# Patient Record
Sex: Female | Born: 1985
Health system: Southern US, Community
[De-identification: ages and names within clinical notes are randomized; demographics above are authoritative.]

## PROBLEM LIST (undated history)

## (undated) DIAGNOSIS — D649 Anemia, unspecified: Secondary | ICD-10-CM

## (undated) HISTORY — PX: LEG SURGERY: SHX1003

---

## 2007-02-04 ENCOUNTER — Emergency Department (HOSPITAL_COMMUNITY): Admission: EM | Admit: 2007-02-04 | Discharge: 2007-02-04 | Payer: Self-pay | Admitting: Emergency Medicine

## 2007-06-26 ENCOUNTER — Other Ambulatory Visit: Admission: RE | Admit: 2007-06-26 | Discharge: 2007-06-26 | Payer: Self-pay | Admitting: Family Medicine

## 2008-12-27 ENCOUNTER — Emergency Department (HOSPITAL_COMMUNITY): Admission: EM | Admit: 2008-12-27 | Discharge: 2008-12-27 | Payer: Self-pay | Admitting: Emergency Medicine

## 2009-10-11 ENCOUNTER — Emergency Department (HOSPITAL_COMMUNITY): Admission: EM | Admit: 2009-10-11 | Discharge: 2009-10-11 | Payer: Self-pay | Admitting: Family Medicine

## 2009-11-16 ENCOUNTER — Other Ambulatory Visit: Admission: RE | Admit: 2009-11-16 | Discharge: 2009-11-16 | Payer: Self-pay | Admitting: Family Medicine

## 2010-03-22 ENCOUNTER — Emergency Department (HOSPITAL_COMMUNITY): Admission: EM | Admit: 2010-03-22 | Discharge: 2010-03-22 | Payer: Self-pay | Admitting: Family Medicine

## 2010-08-06 ENCOUNTER — Emergency Department (HOSPITAL_COMMUNITY)
Admission: EM | Admit: 2010-08-06 | Discharge: 2010-08-06 | Payer: Self-pay | Source: Home / Self Care | Admitting: Family Medicine

## 2010-11-11 LAB — POCT URINALYSIS DIP (DEVICE)
Bilirubin Urine: NEGATIVE
Glucose, UA: NEGATIVE mg/dL
Nitrite: NEGATIVE

## 2010-11-11 LAB — POCT PREGNANCY, URINE: Preg Test, Ur: NEGATIVE

## 2010-11-15 LAB — POCT I-STAT, CHEM 8
Calcium, Ion: 1.13 mmol/L (ref 1.12–1.32)
Hemoglobin: 13.9 g/dL (ref 12.0–15.0)
Sodium: 139 mEq/L (ref 135–145)
TCO2: 28 mmol/L (ref 0–100)

## 2010-11-15 LAB — POCT RAPID STREP A (OFFICE): Streptococcus, Group A Screen (Direct): NEGATIVE

## 2011-06-14 LAB — POCT RAPID STREP A: Streptococcus, Group A Screen (Direct): NEGATIVE

## 2011-09-17 ENCOUNTER — Emergency Department (INDEPENDENT_AMBULATORY_CARE_PROVIDER_SITE_OTHER)
Admission: EM | Admit: 2011-09-17 | Discharge: 2011-09-17 | Disposition: A | Discharge status: Home / Self Care | Payer: 59 | Source: Home / Self Care | Attending: Family Medicine | Admitting: Family Medicine

## 2011-09-17 ENCOUNTER — Encounter (HOSPITAL_COMMUNITY): Payer: Self-pay

## 2011-09-17 DIAGNOSIS — J069 Acute upper respiratory infection, unspecified: Secondary | ICD-10-CM

## 2011-09-17 HISTORY — DX: Anemia, unspecified: D64.9

## 2011-09-17 MED ORDER — PREDNISONE 20 MG PO TABS: ORAL_TABLET | ORAL | Status: AC

## 2011-09-17 MED ORDER — ACETAMINOPHEN-CODEINE 120-12 MG/5ML PO SUSP: 5.0000 mL | Freq: Four times a day (QID) | ORAL | Status: AC | PRN

## 2011-09-17 MED ORDER — AZITHROMYCIN 250 MG PO TABS: 250.0000 mg | ORAL_TABLET | Freq: Every day | ORAL | Status: AC

## 2011-09-17 MED ORDER — FEXOFENADINE-PSEUDOEPHED ER 60-120 MG PO TB12: 1.0000 | ORAL_TABLET | Freq: Two times a day (BID) | ORAL | Status: AC

## 2011-09-17 NOTE — ED Notes (Signed)
C/o runny nose, nasal congestion, cough, bilateral ear pain, headache and sore throat for 3 days.  Denies fever.

## 2011-09-18 NOTE — ED Provider Notes (Signed)
History     CSN: [id number]  Arrival date & time 09/17/11  1618   First MD Initiated Contact with Patient 09/17/11 1740      Chief Complaint  Patient presents with  . URI  . Sore Throat  . Otalgia    (Consider location/radiation/quality/duration/timing/severity/associated sxs/prior treatment) HPI Comments: 26 y/o female h/o recurrent "bad sinusitis" here c/o runny nose nasal congestion, sore throat and sinus pressure for 3 days. No fever. Thinks she is getting worse. Mild non productive cough. Feel ears full and painful. Taking Musinex with no significant improvement.  Takes ampicillin chronically for acne. No chest pain. Appetite is ok.   Past Medical History  Diagnosis Date  . Anemia     Past Surgical History  Procedure Date  . Leg surgery     No family history on file.  History  Substance Use Topics  . Smoking status: Never Smoker   . Smokeless tobacco: Not on file  . Alcohol Use: No    OB History    Grav Para Term Preterm Abortions TAB SAB Ect Mult Living                  Review of Systems  Constitutional: Negative for fever, chills and appetite change.  HENT: Positive for ear pain, congestion, sore throat, rhinorrhea and sinus pressure. Negative for neck pain and neck stiffness.   Respiratory: Positive for cough. Negative for chest tightness, shortness of breath and wheezing.   Cardiovascular: Negative for chest pain.  Gastrointestinal: Negative for nausea, vomiting and abdominal pain.  Musculoskeletal: Negative for myalgias and arthralgias.  Skin: Negative for rash.  Neurological: Positive for headaches.    Allergies  Review of patient's allergies indicates no known allergies.  Home Medications   Current Outpatient Rx  Name Route Sig Dispense Refill  . AMPICILLIN 500 MG PO CAPS Oral Take 500 mg by mouth daily.    . IRON PO Oral Take 27 mg by mouth daily.    . ACETAMINOPHEN-CODEINE 120-12 MG/5ML PO SUSP Oral Take 5 mLs by mouth every 6 (six) hours  as needed for pain. 60 mL 0  . AZITHROMYCIN 250 MG PO TABS Oral Take 1 tablet (250 mg total) by mouth daily. Take first 2 tablets together, then 1 every day until finished. 6 tablet 0  . FEXOFENADINE-PSEUDOEPHED ER 60-120 MG PO TB12 Oral Take 1 tablet by mouth every 12 (twelve) hours. 30 tablet 0  . PREDNISONE 20 MG PO TABS  2 tabs po daily for 5 days 10 tablet no    BP 136/79  Pulse 87  Temp(Src) 99.1 F (37.3 C) (Oral)  Resp 20  SpO2 98%  LMP 09/10/2011  Physical Exam  Nursing note and vitals reviewed. Constitutional: She is oriented to person, place, and time. She appears well-developed and well-nourished. No distress.  Eyes: Conjunctivae and EOM are normal. Pupils are equal, round, and reactive to light. Right eye exhibits no discharge. Left eye exhibits no discharge.       Nasal Congestion with erythema and swelling of nasal turbinates, clear rhinorrhea. Pharyngeal erythema no exudates. No uvula deviation. No trismus. TM's with increased vascular markings and clear fluid behind. No swelling or bulging  Neck: Neck supple. No thyromegaly present.  Cardiovascular: Normal heart sounds.   Pulmonary/Chest: Effort normal and breath sounds normal. No respiratory distress. She has no wheezes. She has no rales. She exhibits no tenderness.  Lymphadenopathy:    She has no cervical adenopathy.  Neurological: She is alert and  oriented to person, place, and time.  Skin: No rash noted.    ED Course  Procedures (including critical care time)  Labs Reviewed - No data to display No results found.   1. URI (upper respiratory infection)       MDM  Impress viral sinusitis. Treated with prednisone, decongestant/antihistamin and saline solution. As per patient request a holding prescription for azithromycin was given to use only if persistent or worsening sinus symptoms after 5-7 days from day #1.         Sharin Grave, MD 09/18/11 1644

## 2014-12-28 ENCOUNTER — Ambulatory Visit: Payer: Self-pay | Admitting: Nurse Practitioner

## 2014-12-28 DIAGNOSIS — Z0289 Encounter for other administrative examinations: Secondary | ICD-10-CM

## 2015-11-22 ENCOUNTER — Emergency Department
Admission: EM | Admit: 2015-11-22 | Discharge: 2015-11-23 | Disposition: A | Discharge status: Home / Self Care | Payer: 59 | Attending: Emergency Medicine | Admitting: Emergency Medicine

## 2015-11-22 ENCOUNTER — Encounter: Payer: Self-pay | Admitting: Emergency Medicine

## 2015-11-22 DIAGNOSIS — R200 Anesthesia of skin: Secondary | ICD-10-CM | POA: Diagnosis not present

## 2015-11-22 DIAGNOSIS — Z792 Long term (current) use of antibiotics: Secondary | ICD-10-CM | POA: Diagnosis not present

## 2015-11-22 DIAGNOSIS — Z79899 Other long term (current) drug therapy: Secondary | ICD-10-CM | POA: Insufficient documentation

## 2015-11-22 DIAGNOSIS — R202 Paresthesia of skin: Secondary | ICD-10-CM

## 2015-11-22 NOTE — ED Provider Notes (Signed)
[facility] Emergency Department Provider Note  ____________________________________________  Time seen: 11:45 PM  I have reviewed the triage vital signs and the nursing notes.   HISTORY  Chief Complaint Headache and Tingling      HPI Jade Donovan is a 30 y.o. female with history of recently starting birth control Kandice Robinsons) 1 week ago presents to the emergency department with numbness and tingling sensation to the right forearm extending from the elbow to the hand first noted today. Patient also admits generalized headache and nausea 1 week since been taking Yazmin. Patient denies any shortness of breath no nausea no vomiting no gait instability no weakness or visual changes.  Past Medical History  Diagnosis Date  . Anemia     There are no active problems to display for this patient.   Past Surgical History  Procedure Laterality Date  . Leg surgery      Current Outpatient Rx  Name  Route  Sig  Dispense  Refill  . sertraline (ZOLOFT) 50 MG tablet   Oral   Take 50 mg by mouth daily.         Marland Kitchen ampicillin (PRINCIPEN) 500 MG capsule   Oral   Take 500 mg by mouth daily.         . IRON PO   Oral   Take 27 mg by mouth daily.           Allergies Review of patient's allergies indicates no known allergies.  No family history on file.  Social History Social History  Substance Use Topics  . Smoking status: Never Smoker   . Smokeless tobacco: None  . Alcohol Use: No    Review of Systems  Constitutional: Negative for fever. Eyes: Negative for visual changes. ENT: Negative for sore throat. Cardiovascular: Negative for chest pain. Respiratory: Negative for shortness of breath. Gastrointestinal: Negative for abdominal pain, vomiting and diarrhea. Genitourinary: Negative for dysuria. Musculoskeletal: Negative for back pain. Skin: Negative for rash. Neurological: Negative for headaches, focal weakness or numbness. Right forearm  numbness/tingling  10-point ROS otherwise negative.  ____________________________________________   PHYSICAL EXAM:  VITAL SIGNS: ED Triage Vitals  Enc Vitals Group     BP 11/22/15 2100 133/87 mmHg     Pulse Rate 11/22/15 2100 72     Resp 11/22/15 2100 20     Temp 11/22/15 2100 98.1 F (36.7 C)     Temp Source 11/22/15 2100 Oral     SpO2 11/22/15 2100 100 %     Weight 11/22/15 2100 272 lb (123.378 kg)     Height 11/22/15 2100  (1.702 m)     Head Cir --      Peak Flow --      Pain Score 11/22/15 2058 4     Pain Loc --      Pain Edu? --      Excl. in GC? --      Constitutional: Alert and oriented. Well appearing and in no distress. Eyes: Conjunctivae are normal. PERRL. Normal extraocular movements. ENT   Head: Normocephalic and atraumatic.   Nose: No congestion/rhinnorhea.   Mouth/Throat: Mucous membranes are moist.   Neck: No stridor. Hematological/Lymphatic/Immunilogical: No cervical lymphadenopathy. Cardiovascular: Normal rate, regular rhythm. Normal and symmetric distal pulses are present in all extremities. No murmurs, rubs, or gallops. Respiratory: Normal respiratory effort without tachypnea nor retractions. Breath sounds are clear and equal bilaterally. No wheezes/rales/rhonchi. Gastrointestinal: Soft and nontender. No distention. There is no CVA tenderness. Genitourinary: deferred Musculoskeletal:  Nontender with normal range of motion in all extremities. No joint effusions.  No lower extremity tenderness nor edema. Neurologic:  Normal speech and language. No gross focal neurologic deficits are appreciated. Speech is normal.  Skin:  Skin is warm, dry and intact. No rash noted. Psychiatric: Mood and affect are normal. Speech and behavior are normal. Patient exhibits appropriate insight and judgment.   RADIOLOGY    CT Head Wo Contrast (Final result) Result time: 11/23/15 02:22:45   Final result by Rad Results In Interface (11/23/15 02:22:45)    Narrative:   CLINICAL DATA: Acute onset of right arm tingling and generalized headache. Initial encounter.  EXAM: CT HEAD WITHOUT CONTRAST  TECHNIQUE: Contiguous axial images were obtained from the base of the skull through the vertex without intravenous contrast.  COMPARISON: None.  FINDINGS: There is no evidence of acute infarction, mass lesion, or intra- or extra-axial hemorrhage on CT.  The posterior fossa, including the cerebellum, brainstem and fourth ventricle, is within normal limits. The third and lateral ventricles, and basal ganglia are unremarkable in appearance. The cerebral hemispheres are symmetric in appearance, with normal gray-white differentiation. No mass effect or midline shift is seen.  There is no evidence of fracture; visualized osseous structures are unremarkable in appearance. There is incomplete fusion of the anterior and posterior arches of C1. The orbits are within normal limits. The paranasal sinuses and mastoid air cells are well-aerated. No significant soft tissue abnormalities are seen.  IMPRESSION: Unremarkable noncontrast CT of the head.   Electronically Signed By: Roanna Raider M.D. On: 11/23/2015 02:22          US Venous Img Upper Uni Right (Final result) Result time: 11/23/15 01:16:59   Final result by Rad Results In Interface (11/23/15 01:16:59)   Narrative:   CLINICAL DATA: Acute onset of numbness and tingling at the right arm. Initial encounter.  EXAM: RIGHT UPPER EXTREMITY VENOUS DOPPLER ULTRASOUND  TECHNIQUE: Gray-scale sonography with graded compression, as well as color Doppler and duplex ultrasound were performed to evaluate the upper extremity deep venous system from the level of the subclavian vein and including the jugular, axillary, basilic, radial, ulnar and upper cephalic vein. Spectral Doppler was utilized to evaluate flow at rest and with distal augmentation maneuvers.  COMPARISON:  None.  FINDINGS: Contralateral Subclavian Vein: Respiratory phasicity is normal and symmetric with the symptomatic side. No evidence of thrombus. Normal compressibility.  Internal Jugular Vein: No evidence of thrombus. Normal compressibility, respiratory phasicity and response to augmentation.  Subclavian Vein: No evidence of thrombus. Normal compressibility, respiratory phasicity and response to augmentation.  Axillary Vein: No evidence of thrombus. Normal compressibility, respiratory phasicity and response to augmentation.  Cephalic Vein: No evidence of thrombus. Normal compressibility, respiratory phasicity and response to augmentation.  Basilic Vein: No evidence of thrombus. Normal compressibility, respiratory phasicity and response to augmentation.  Brachial Veins: No evidence of thrombus. Normal compressibility, respiratory phasicity and response to augmentation.  Radial Veins: No evidence of thrombus. Normal compressibility, respiratory phasicity and response to augmentation.  Ulnar Veins: No evidence of thrombus. Normal compressibility, respiratory phasicity and response to augmentation.  Venous Reflux: None visualized.  Other Findings: None visualized.  IMPRESSION: No evidence of deep venous thrombosis.   Electronically Signed By: Roanna Raider M.D. On: 11/23/2015 01:16      INITIAL IMPRESSION / ASSESSMENT AND PLAN / ED COURSE  Pertinent labs & imaging results that were available during my care of the patient were reviewed by me and considered in my medical decision  making (see chart for details).   ____________________________________________   FINAL CLINICAL IMPRESSION(S) / ED DIAGNOSES  Final diagnoses:  Numbness and tingling  Paresthesia      Darci Currentandolph N Niaomi Cartaya, MD 11/25/15 [phone]

## 2015-11-22 NOTE — ED Notes (Addendum)
Patient ambulatory to triage with steady gait, without difficulty or distress noted; pt reports tingling from right elbow down FA today; denies any swelling or weakness; denies any CP, denies SHOB; also reports generalized HA x week after beginning yazmin; st hx HA

## 2015-11-22 NOTE — ED Notes (Signed)
Discussed pt's symptoms with Dr Inocencio HomesGayle; no orders to be performed at this time

## 2015-11-22 NOTE — ED Notes (Signed)
MD at bedside. 

## 2015-11-23 ENCOUNTER — Emergency Department: Payer: 59

## 2015-11-23 NOTE — ED Notes (Signed)
Pt alert and oriented X4, active, cooperative, pt in NAD. RR even and unlabored, color WNL.  Pt informed to return if any life threatening symptoms occur.   

## 2015-11-23 NOTE — Discharge Instructions (Signed)
Paresthesia Paresthesia is an abnormal burning or prickling sensation. This sensation is generally felt in the hands, arms, legs, or feet. However, it may occur in any part of the body. Usually, it is not painful. The feeling may be described as:  Tingling or numbness.  Pins and needles.  Skin crawling.  Buzzing.  Limbs falling asleep.  Itching. Most people experience temporary (transient) paresthesia at some time in their lives. Paresthesia may occur when you breathe too quickly (hyperventilation). It can also occur without any apparent cause. Commonly, paresthesia occurs when pressure is placed on a nerve. The sensation quickly goes away after the pressure is removed. For some people, however, paresthesia is a long-lasting (chronic) condition that is caused by an underlying disorder. If you continue to have paresthesia, you may need further medical evaluation. HOME CARE INSTRUCTIONS Watch your condition for any changes. Taking the following actions may help to lessen any discomfort that you are feeling:  Avoid drinking alcohol.  Try acupuncture or massage to help relieve your symptoms.  Keep all follow-up visits as directed by your health care provider. This is important. SEEK MEDICAL CARE IF:  You continue to have episodes of paresthesia.  Your burning or prickling feeling gets worse when you walk.  You have pain, cramps, or dizziness.  You develop a rash. SEEK IMMEDIATE MEDICAL CARE IF:  You feel weak.  You have trouble walking or moving.  You have problems with speech, understanding, or vision.  You feel confused.  You cannot control your bladder or bowel movements.  You have numbness after an injury.  You faint.   This information is not intended to replace advice given to you by your health care provider. Make sure you discuss any questions you have with your health care provider.   Document Released: 08/03/2002 Document Revised: 12/28/2014 Document Reviewed:  08/09/2014 Elsevier Interactive Patient Education 2016 [employer]  

## 2016-08-14 DIAGNOSIS — J329 Chronic sinusitis, unspecified: Secondary | ICD-10-CM | POA: Diagnosis not present

## 2016-11-14 DIAGNOSIS — F5105 Insomnia due to other mental disorder: Secondary | ICD-10-CM | POA: Diagnosis not present

## 2016-11-14 DIAGNOSIS — F330 Major depressive disorder, recurrent, mild: Secondary | ICD-10-CM | POA: Diagnosis not present

## 2016-11-17 ENCOUNTER — Encounter (HOSPITAL_COMMUNITY): Payer: Self-pay | Admitting: *Deleted

## 2016-11-17 ENCOUNTER — Ambulatory Visit (HOSPITAL_COMMUNITY)
Admission: EM | Admit: 2016-11-17 | Discharge: 2016-11-17 | Disposition: A | Discharge status: Home / Self Care | Payer: BLUE CROSS/BLUE SHIELD | Attending: Internal Medicine | Admitting: Internal Medicine

## 2016-11-17 DIAGNOSIS — R69 Illness, unspecified: Secondary | ICD-10-CM | POA: Diagnosis not present

## 2016-11-17 DIAGNOSIS — J111 Influenza due to unidentified influenza virus with other respiratory manifestations: Secondary | ICD-10-CM

## 2016-11-17 MED ORDER — OSELTAMIVIR PHOSPHATE 75 MG PO CAPS: 75.0000 mg | ORAL_CAPSULE | Freq: Two times a day (BID) | ORAL | 0 refills | Status: AC

## 2016-11-17 NOTE — Discharge Instructions (Signed)
Take the Tamiflu as prescribed. 1) a lot of rest and a lot of hydration. 2) Continue over-the-counter Delsym or Robitussin for coughing 3) May continue Mucinex for chest congestion.  4) continue ibuprofen or Tylenol for the headache and body ache.  5) also to use Sudafed or Afrin nasal spray for the congestion.  6) please follow up with her primary care doctor for no improvement

## 2016-11-17 NOTE — ED Triage Notes (Signed)
Pt  Reports   Body  Aches      And  Some   cilss   And  Not  Feeling   Well  X  4  Days    She  Reports  Being  Weak        And     Not  Feeling  Well        She  Is  Masked  And  Is  In  A    Private  Room   She      Is   Sitting  Upright   On the  Exam table  In no  Severe  Distress

## 2016-11-17 NOTE — ED Provider Notes (Signed)
CSN: [id number]     Arrival date & time 11/17/16  1204 History   First MD Initiated Contact with Patient 11/17/16 1305     Chief Complaint  Patient presents with  . Generalized Body Aches   (Consider location/radiation/quality/duration/timing/severity/associated sxs/prior Treatment) Patient is a fairly healthy 31 year old female, started getting sick suddenly on Wednesday (3 days ago) with fever, chills, fatigue, body aches, headache, congestion, and coughing. She also endorses come chest pain during coughing and a mild sore throat.  She also threw up a few times on Wednesday but vomiting only lasted for 1 day and resolved. Patient did not check temperature at home, reports that she knew she had a fever as she felt very hot and warm. She denies any known sick contact exposure but was at a funeral recently. She have been taking Advil and mucinex at home.         Past Medical History:  Diagnosis Date  . Anemia    Past Surgical History:  Procedure Laterality Date  . LEG SURGERY     History reviewed. No pertinent family history. Social History  Substance Use Topics  . Smoking status: Never Smoker  . Smokeless tobacco: Not on file  . Alcohol use No   OB History    No data available     Review of Systems  Constitutional: Positive for chills, fatigue and fever.  HENT: Positive for congestion, rhinorrhea, sneezing and sore throat. Negative for ear pain.   Respiratory: Positive for cough. Negative for shortness of breath and wheezing.   Cardiovascular: Positive for chest pain. Negative for palpitations.  Gastrointestinal: Positive for abdominal pain, nausea and vomiting.  Musculoskeletal: Positive for myalgias.  Skin: Negative for rash.  Neurological: Positive for headaches. Negative for dizziness.    Allergies  Patient has no known allergies.  Home Medications   Prior to Admission medications   Medication Sig Start Date End Date Taking? Authorizing Provider  ampicillin  (PRINCIPEN) 500 MG capsule Take 500 mg by mouth daily.    Historical Provider, MD  IRON PO Take 27 mg by mouth daily.    Historical Provider, MD  oseltamivir (TAMIFLU) 75 MG capsule Take 1 capsule (75 mg total) by mouth every 12 (twelve) hours. 11/17/16 11/22/16  Lucia Estelle, NP  sertraline (ZOLOFT) 50 MG tablet Take 50 mg by mouth daily.    Historical Provider, MD  YASMIN 28 3-0.03 MG tablet Take 1 tablet by mouth daily.    Historical Provider, MD   Meds Ordered and Administered this Visit  Medications - No data to display  BP 132/70 (BP Location: Right Arm)   Pulse 78   Temp 98.6 F (37 C) (Oral)   Resp 18   LMP 10/27/2016   SpO2 100%  No data found.   Physical Exam  Constitutional: She is oriented to person, place, and time. She appears well-developed and well-nourished.  HENT:  Head: Normocephalic and atraumatic.  Right Ear: External ear normal.  Left Ear: External ear normal.  Nose: Nose normal.  Mouth/Throat: Oropharynx is clear and moist. No oropharyngeal exudate.  Eyes: Conjunctivae and EOM are normal. Pupils are equal, round, and reactive to light. Right eye exhibits no discharge. Left eye exhibits no discharge.  Neck: Normal range of motion. Neck supple.  Cardiovascular: Normal rate, regular rhythm and normal heart sounds.   Pulmonary/Chest: Effort normal and breath sounds normal.  Abdominal: Soft. Bowel sounds are normal. She exhibits no distension. There is no tenderness.  Lymphadenopathy:    She  has no cervical adenopathy.  Neurological: She is alert and oriented to person, place, and time. No cranial nerve deficit. Coordination normal.  Skin: Skin is warm and dry.  Nursing note and vitals reviewed.   Urgent Care Course     Procedures (including critical care time)  Labs Review Labs Reviewed - No data to display  Imaging Review No results found.  MDM   1. Influenza-like illness    Most likely an URI but Influenza is also in differential. We had a long  discussion about tamiflu; patient would like to try the Tamiflu. Also educated on rest, hydration, and OTC treatment options including delsym, robitussin for cough. Mucinex for chest congestion. Sudafed or Afrin nasal spray for congestion, Ibuprofen and tylenol for fever and body ache. Informed to f/u with PCP for worsening symptoms or for no improvement.     Lucia EstelleFeng Dioselina Brumbaugh, NP 11/17/16 1339

## 2016-12-28 DIAGNOSIS — F330 Major depressive disorder, recurrent, mild: Secondary | ICD-10-CM | POA: Diagnosis not present

## 2016-12-28 DIAGNOSIS — F5105 Insomnia due to other mental disorder: Secondary | ICD-10-CM | POA: Diagnosis not present

## 2017-01-16 DIAGNOSIS — H5213 Myopia, bilateral: Secondary | ICD-10-CM | POA: Diagnosis not present

## 2017-03-07 DIAGNOSIS — F330 Major depressive disorder, recurrent, mild: Secondary | ICD-10-CM | POA: Diagnosis not present

## 2017-03-07 DIAGNOSIS — F5105 Insomnia due to other mental disorder: Secondary | ICD-10-CM | POA: Diagnosis not present

## 2017-04-02 DIAGNOSIS — L811 Chloasma: Secondary | ICD-10-CM | POA: Diagnosis not present

## 2017-04-15 DIAGNOSIS — R87810 Cervical high risk human papillomavirus (HPV) DNA test positive: Secondary | ICD-10-CM | POA: Diagnosis not present

## 2017-04-15 DIAGNOSIS — Z01419 Encounter for gynecological examination (general) (routine) without abnormal findings: Secondary | ICD-10-CM | POA: Diagnosis not present

## 2017-04-15 DIAGNOSIS — Z30431 Encounter for routine checking of intrauterine contraceptive device: Secondary | ICD-10-CM | POA: Diagnosis not present

## 2017-04-15 DIAGNOSIS — R87610 Atypical squamous cells of undetermined significance on cytologic smear of cervix (ASC-US): Secondary | ICD-10-CM | POA: Diagnosis not present

## 2017-05-30 DIAGNOSIS — Z23 Encounter for immunization: Secondary | ICD-10-CM | POA: Diagnosis not present

## 2017-06-06 DIAGNOSIS — F330 Major depressive disorder, recurrent, mild: Secondary | ICD-10-CM | POA: Diagnosis not present

## 2017-06-06 DIAGNOSIS — F5105 Insomnia due to other mental disorder: Secondary | ICD-10-CM | POA: Diagnosis not present

## 2017-07-25 DIAGNOSIS — L700 Acne vulgaris: Secondary | ICD-10-CM | POA: Diagnosis not present

## 2017-08-11 ENCOUNTER — Encounter: Payer: Self-pay | Admitting: Gynecology

## 2017-08-11 ENCOUNTER — Ambulatory Visit
Admission: EM | Admit: 2017-08-11 | Discharge: 2017-08-11 | Disposition: A | Discharge status: Home / Self Care | Payer: BLUE CROSS/BLUE SHIELD | Attending: Family Medicine | Admitting: Family Medicine

## 2017-08-11 ENCOUNTER — Other Ambulatory Visit: Payer: Self-pay

## 2017-08-11 DIAGNOSIS — H6502 Acute serous otitis media, left ear: Secondary | ICD-10-CM | POA: Diagnosis not present

## 2017-08-11 DIAGNOSIS — R51 Headache: Secondary | ICD-10-CM

## 2017-08-11 DIAGNOSIS — J0110 Acute frontal sinusitis, unspecified: Secondary | ICD-10-CM | POA: Diagnosis not present

## 2017-08-11 MED ORDER — AMOXICILLIN 875 MG PO TABS: 875.0000 mg | ORAL_TABLET | Freq: Two times a day (BID) | ORAL | 0 refills | Status: AC

## 2017-08-11 NOTE — ED Triage Notes (Signed)
Patient c/o sinus problem and left ear pain x 2 week.

## 2017-08-11 NOTE — ED Provider Notes (Signed)
[facility]    CSN: [id number] Arrival date & time: 08/11/17  1520     History   Chief Complaint Chief Complaint  Patient presents with  . Sinusitis    HPI Jade Donovan is a 31 y.o. female.   The history is provided by the patient.  Sinusitis  Associated symptoms: congestion, cough, ear pain, headaches and rhinorrhea   URI  Presenting symptoms: congestion, cough, ear pain, facial pain and rhinorrhea   Severity:  Moderate Onset quality:  Sudden Duration:  2 weeks Timing:  Constant Progression:  Worsening Chronicity:  New Relieved by:  Nothing Ineffective treatments:  OTC medications Associated symptoms: headaches and sinus pain   Risk factors: sick contacts   Risk factors: not elderly, no chronic cardiac disease, no chronic kidney disease, no chronic respiratory disease, no diabetes mellitus, no immunosuppression, no recent illness and no recent travel     Past Medical History:  Diagnosis Date  . Anemia     There are no active problems to display for this patient.   Past Surgical History:  Procedure Laterality Date  . LEG SURGERY      OB History    No data available       Home Medications    Prior to Admission medications   Medication Sig Start Date End Date Taking? Authorizing Provider  IRON PO Take 27 mg by mouth daily.   Yes [provider]  levonorgestrel (MIRENA) 20 MCG/24HR IUD 1 each by Intrauterine route once.   Yes [provider]  sertraline (ZOLOFT) 50 MG tablet Take 50 mg by mouth daily.   Yes [provider]  amoxicillin (AMOXIL) 875 MG tablet Take 1 tablet (875 mg total) by mouth 2 (two) times daily. 08/11/17   Payton Mccallumonty, Jasiyah Poland, MD  ampicillin (PRINCIPEN) 500 MG capsule Take 500 mg by mouth daily.    [provider]  YASMIN 28 3-0.03 MG tablet Take 1 tablet by mouth daily.    [provider]    Family History Family History  Problem Relation Age of Onset  . Hyperlipidemia  Mother   . Diabetes Mother   . Hyperlipidemia Father   . Diabetes Father     Social History Social History   Tobacco Use  . Smoking status: Never Smoker  . Smokeless tobacco: Never Used  Substance Use Topics  . Alcohol use: No  . Drug use: No     Allergies   Patient has no known allergies.   Review of Systems Review of Systems  HENT: Positive for congestion, ear pain, rhinorrhea and sinus pain.   Respiratory: Positive for cough.   Neurological: Positive for headaches.     Physical Exam Triage Vital Signs ED Triage Vitals  Enc Vitals Group     BP 08/11/17 1532 126/81     Pulse Rate 08/11/17 1532 88     Resp 08/11/17 1532 16     Temp 08/11/17 1532 98.1 F (36.7 C)     Temp Source 08/11/17 1532 Oral     SpO2 08/11/17 1532 98 %     Weight 08/11/17 1530 285 lb (129.3 kg)     Height 08/11/17 1530 5\' 7"  (1.702 m)     Head Circumference --      Peak Flow --      Pain Score 08/11/17 1530 6     Pain Loc --      Pain Edu? --      Excl. in GC? --  No data found.  Updated Vital Signs BP 126/81 (BP Location: Left Arm)   Pulse 88   Temp 98.1 F (36.7 C) (Oral)   Resp 16   Ht 5\' 7"  (1.702 m)   Wt 285 lb (129.3 kg)   SpO2 98%   BMI 44.64 kg/m   Visual Acuity Right Eye Distance:   Left Eye Distance:   Bilateral Distance:    Right Eye Near:   Left Eye Near:    Bilateral Near:     Physical Exam  Constitutional: She appears well-developed and well-nourished. No distress.  HENT:  Head: Normocephalic and atraumatic.  Right Ear: Tympanic membrane, external ear and ear canal normal.  Left Ear: External ear and ear canal normal. Tympanic membrane is erythematous and bulging. A middle ear effusion is present.  Nose: Mucosal edema and rhinorrhea present. No nose lacerations, sinus tenderness, nasal deformity, septal deviation or nasal septal hematoma. No epistaxis.  No foreign bodies. Right sinus exhibits frontal sinus tenderness. Right sinus exhibits no maxillary  sinus tenderness. Left sinus exhibits frontal sinus tenderness. Left sinus exhibits no maxillary sinus tenderness.  Mouth/Throat: Uvula is midline, oropharynx is clear and moist and mucous membranes are normal. No oropharyngeal exudate.  Eyes: Conjunctivae and EOM are normal. Pupils are equal, round, and reactive to light. Right eye exhibits no discharge. Left eye exhibits no discharge. No scleral icterus.  Neck: Normal range of motion. Neck supple. No thyromegaly present.  Cardiovascular: Normal rate, regular rhythm and normal heart sounds.  Pulmonary/Chest: Effort normal and breath sounds normal. No respiratory distress. She has no wheezes. She has no rales.  Lymphadenopathy:    She has no cervical adenopathy.  Skin: She is not diaphoretic.  Nursing note and vitals reviewed.    UC Treatments / Results  Labs (all labs ordered are listed, but only abnormal results are displayed) Labs Reviewed - No data to display  EKG  EKG Interpretation None       Radiology No results found.  Procedures Procedures (including critical care time)  Medications Ordered in UC Medications - No data to display   Initial Impression / Assessment and Plan / UC Course  I have reviewed the triage vital signs and the nursing notes.  Pertinent labs & imaging results that were available during my care of the patient were reviewed by me and considered in my medical decision making (see chart for details).       Final Clinical Impressions(s) / UC Diagnoses   Final diagnoses:  Acute serous otitis media of left ear, recurrence not specified  Acute frontal sinusitis, recurrence not specified    ED Discharge Orders        Ordered    amoxicillin (AMOXIL) 875 MG tablet  2 times daily     08/11/17 1622     1. diagnosis reviewed with patient 2. rx as per orders above; reviewed possible side effects, interactions, risks and benefits  3. Recommend supportive treatment with otc analgesics prn  4.  Follow-up prn if symptoms worsen or don't improve   Controlled Substance Prescriptions [place] Controlled Substance Registry consulted? Not applicable   Payton Mccallumonty, Salvador Coupe, MD 08/11/17 [phone]

## 2017-09-06 DIAGNOSIS — F330 Major depressive disorder, recurrent, mild: Secondary | ICD-10-CM | POA: Diagnosis not present

## 2017-09-06 DIAGNOSIS — F5105 Insomnia due to other mental disorder: Secondary | ICD-10-CM | POA: Diagnosis not present

## 2017-10-22 IMAGING — CT CT HEAD W/O CM
2 series · 15 of 30 positions shown, 19 images · non-contrast
Comparison: None.

CLINICAL DATA: Acute onset of right arm tingling and generalized
headache. Initial encounter.

EXAM:
CT HEAD WITHOUT CONTRAST
TECHNIQUE: Contiguous axial images were obtained from the base of the skull
through the vertex without intravenous contrast.

[Series 2: soft tissue · axial · 0.41mm/px · z∈[+170,+190]mm · 2 of 30 slices shown]
[im 3/30  brain]
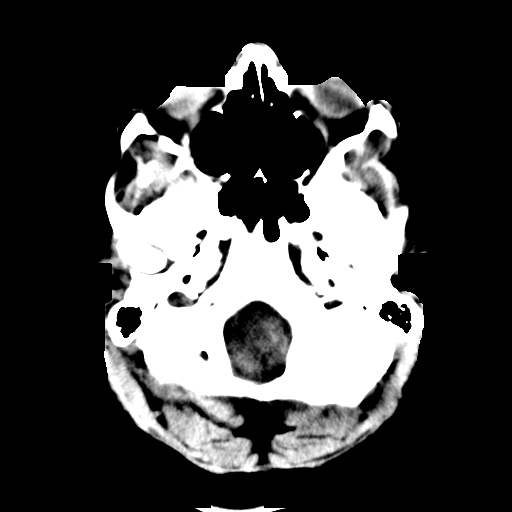
[im 7/30  brain]
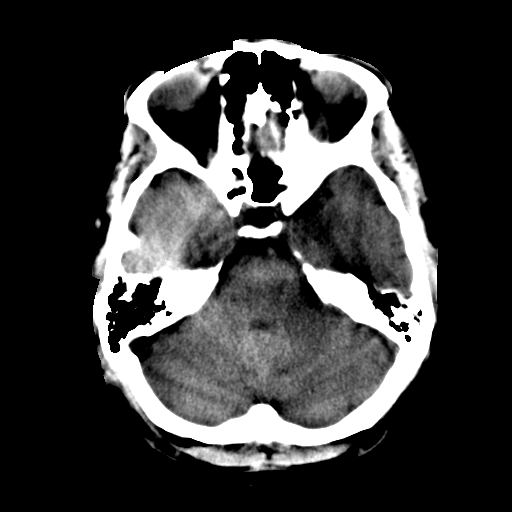

[Series 4: soft tissue recon · axial · 0.41mm/px · z∈[+152,+275]mm · 13 of 31 slices shown, 17 images]
[im 3/31  brain]
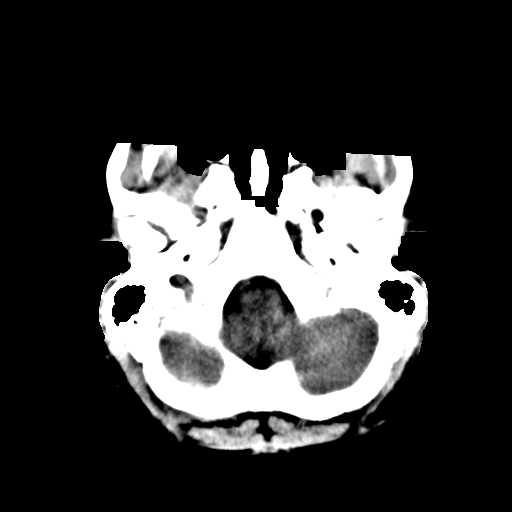
[im 3/31  bone]
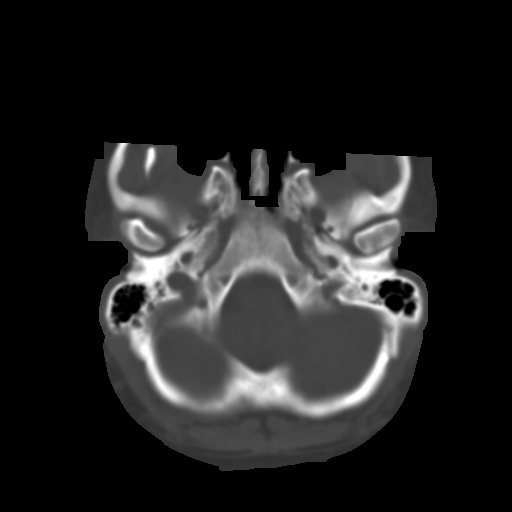
[im 5/31  brain]
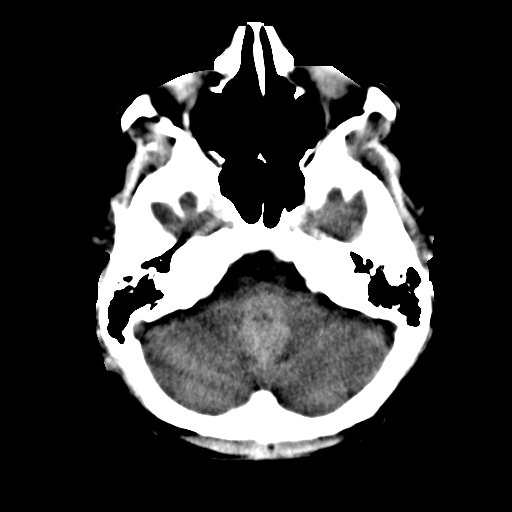
[im 7/31  brain]
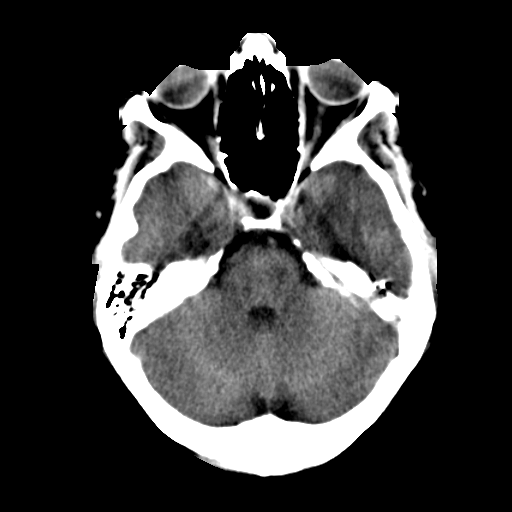
[im 9/31  brain]
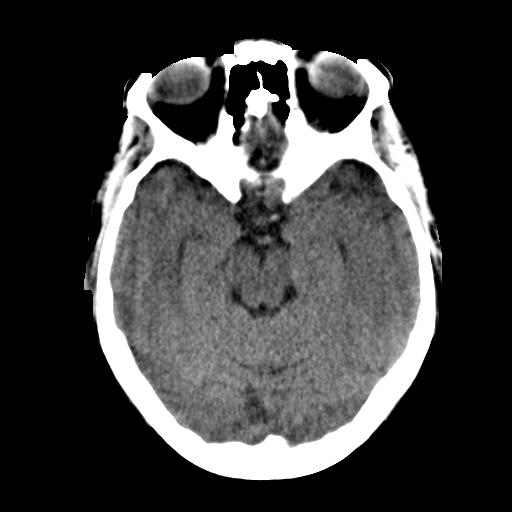
[im 11/31  brain]
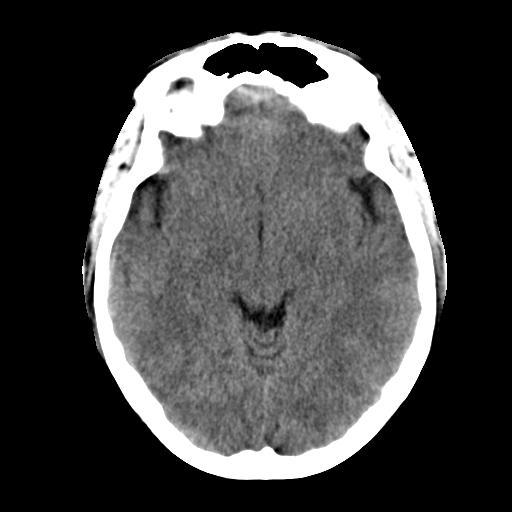
[im 11/31  bone]
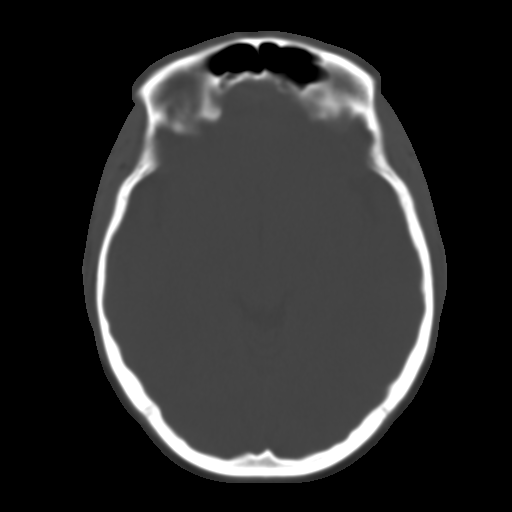
[im 13/31  brain]
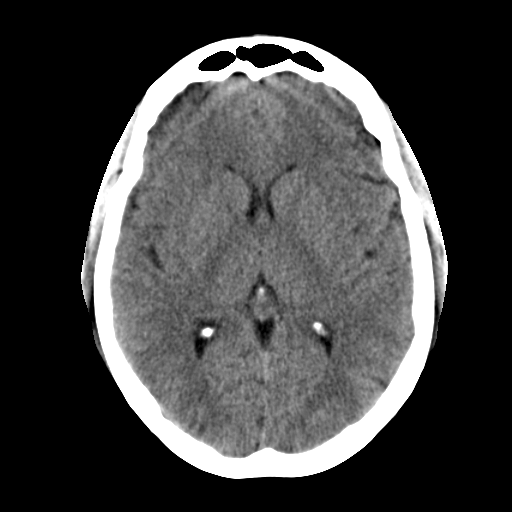
[im 16/31  brain]
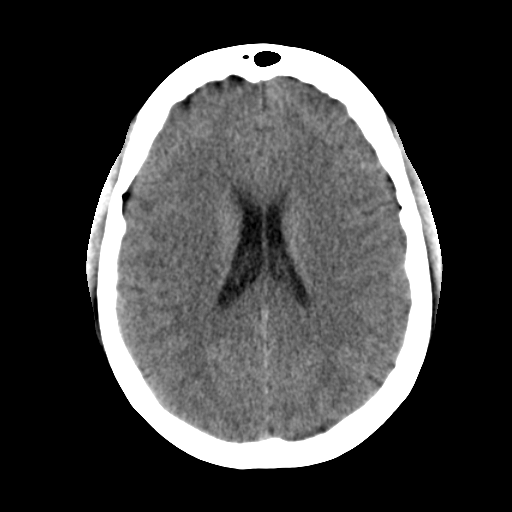
[im 18/31  brain]
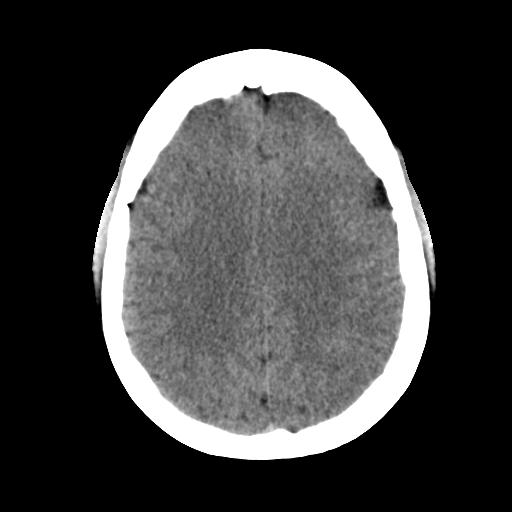
[im 20/31  brain]
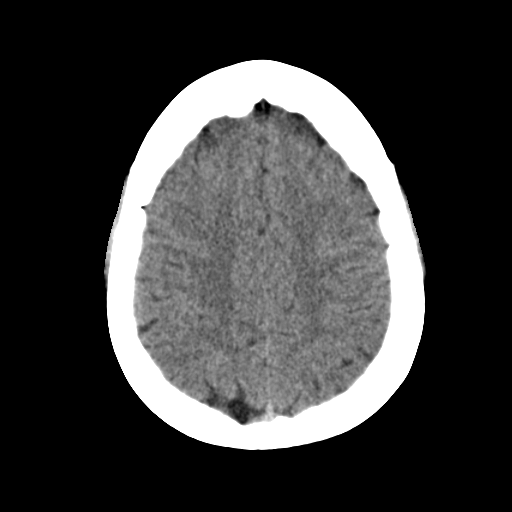
[im 20/31  bone]
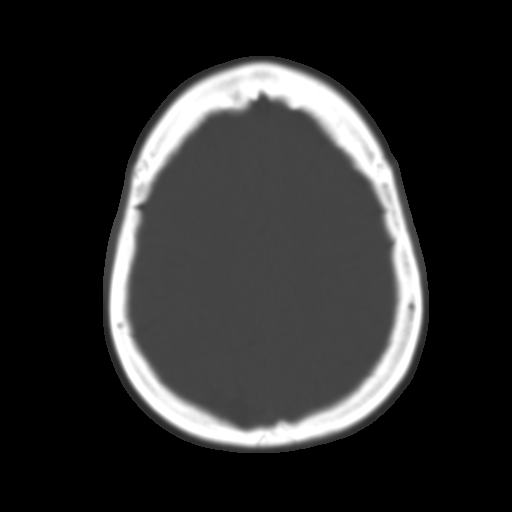
[im 22/31  brain]
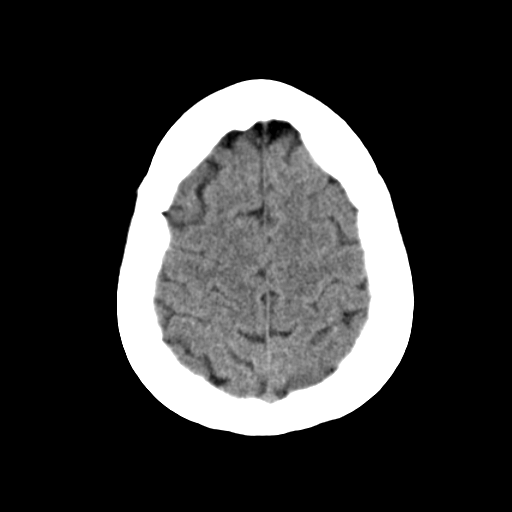
[im 24/31  brain]
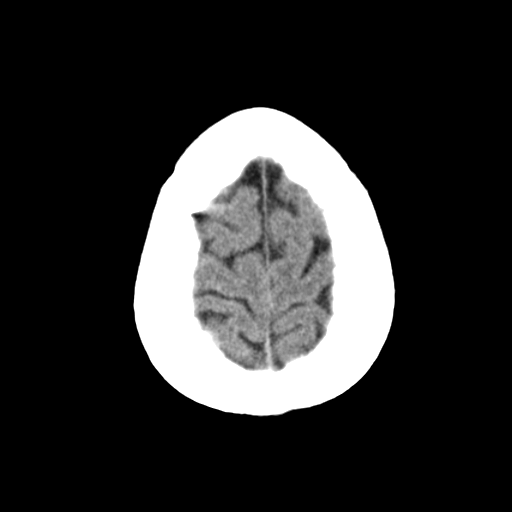
[im 26/31  brain]
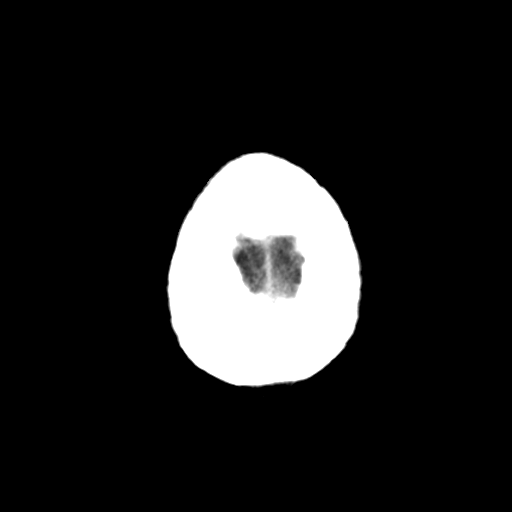
[im 28/31  brain]
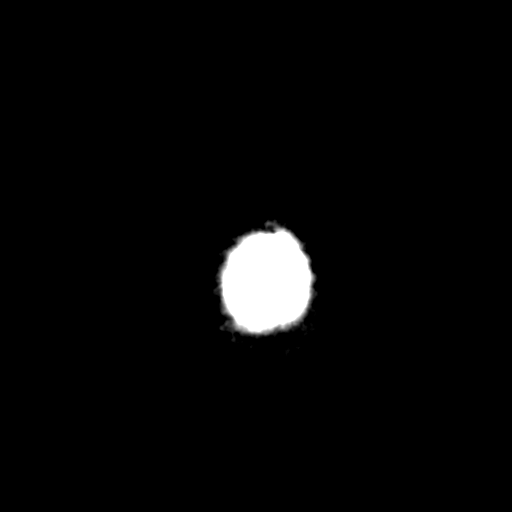
[im 28/31  bone]
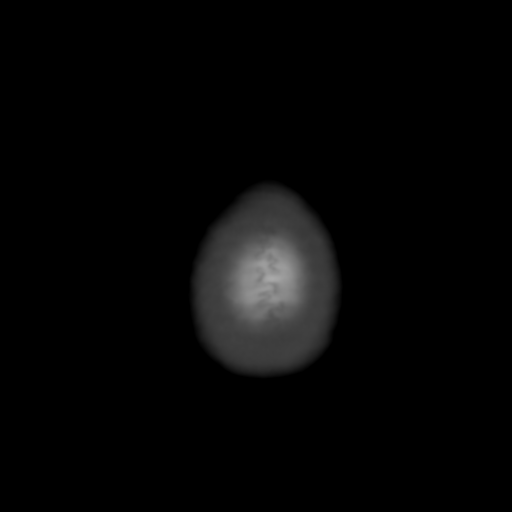

[15 of 30 positions shown; findings below may reference images not displayed]

FINDINGS: There is no evidence of acute infarction, mass lesion, or intra- or
extra-axial hemorrhage on CT.

The posterior fossa, including the cerebellum, brainstem and fourth
ventricle, is within normal limits. The third and lateral
ventricles, and basal ganglia are unremarkable in appearance. The
cerebral hemispheres are symmetric in appearance, with normal
gray-white differentiation. No mass effect or midline shift is seen.

There is no evidence of fracture; visualized osseous structures are
unremarkable in appearance. There is incomplete fusion of the
anterior and posterior arches of C1. The orbits are within normal
limits. The paranasal sinuses and mastoid air cells are
well-aerated. No significant soft tissue abnormalities are seen.
IMPRESSION: Unremarkable noncontrast CT of the head.

## 2017-12-10 DIAGNOSIS — T192XXA Foreign body in vulva and vagina, initial encounter: Secondary | ICD-10-CM | POA: Diagnosis not present

## 2017-12-19 DIAGNOSIS — J0140 Acute pansinusitis, unspecified: Secondary | ICD-10-CM | POA: Diagnosis not present

## 2018-01-08 DIAGNOSIS — F330 Major depressive disorder, recurrent, mild: Secondary | ICD-10-CM | POA: Diagnosis not present

## 2018-01-08 DIAGNOSIS — F5105 Insomnia due to other mental disorder: Secondary | ICD-10-CM | POA: Diagnosis not present

## 2018-01-17 ENCOUNTER — Other Ambulatory Visit (HOSPITAL_COMMUNITY): Payer: Self-pay | Admitting: Otolaryngology

## 2018-01-17 DIAGNOSIS — J322 Chronic ethmoidal sinusitis: Secondary | ICD-10-CM | POA: Diagnosis not present

## 2018-01-17 DIAGNOSIS — J040 Acute laryngitis: Secondary | ICD-10-CM | POA: Diagnosis not present

## 2018-01-17 DIAGNOSIS — J320 Chronic maxillary sinusitis: Secondary | ICD-10-CM | POA: Diagnosis not present

## 2018-01-17 DIAGNOSIS — H5213 Myopia, bilateral: Secondary | ICD-10-CM | POA: Diagnosis not present

## 2018-01-17 DIAGNOSIS — J342 Deviated nasal septum: Secondary | ICD-10-CM | POA: Diagnosis not present

## 2018-01-24 ENCOUNTER — Ambulatory Visit: Payer: BLUE CROSS/BLUE SHIELD

## 2018-01-24 DIAGNOSIS — J3489 Other specified disorders of nose and nasal sinuses: Secondary | ICD-10-CM | POA: Diagnosis not present

## 2018-01-24 DIAGNOSIS — J342 Deviated nasal septum: Secondary | ICD-10-CM | POA: Diagnosis not present

## 2018-01-24 DIAGNOSIS — J328 Other chronic sinusitis: Secondary | ICD-10-CM | POA: Diagnosis not present

## 2018-01-27 DIAGNOSIS — J320 Chronic maxillary sinusitis: Secondary | ICD-10-CM | POA: Diagnosis not present

## 2018-01-27 DIAGNOSIS — J342 Deviated nasal septum: Secondary | ICD-10-CM | POA: Diagnosis not present

## 2018-01-27 DIAGNOSIS — J343 Hypertrophy of nasal turbinates: Secondary | ICD-10-CM | POA: Diagnosis not present

## 2018-02-12 DIAGNOSIS — J343 Hypertrophy of nasal turbinates: Secondary | ICD-10-CM | POA: Diagnosis not present

## 2018-02-12 DIAGNOSIS — J342 Deviated nasal septum: Secondary | ICD-10-CM | POA: Diagnosis not present

## 2018-02-12 DIAGNOSIS — J320 Chronic maxillary sinusitis: Secondary | ICD-10-CM | POA: Diagnosis not present

## 2018-03-18 IMAGING — US US EXTREM  UP VENOUS*R*
1 series · 13 of 24 positions shown · non-contrast
Comparison: None.

CLINICAL DATA: Acute onset of numbness and tingling at the right
arm. Initial encounter.



[Series 1: us extrem up venous*right* · 0.10mm/px · 13 of 32 slices shown]
[im 1/32]
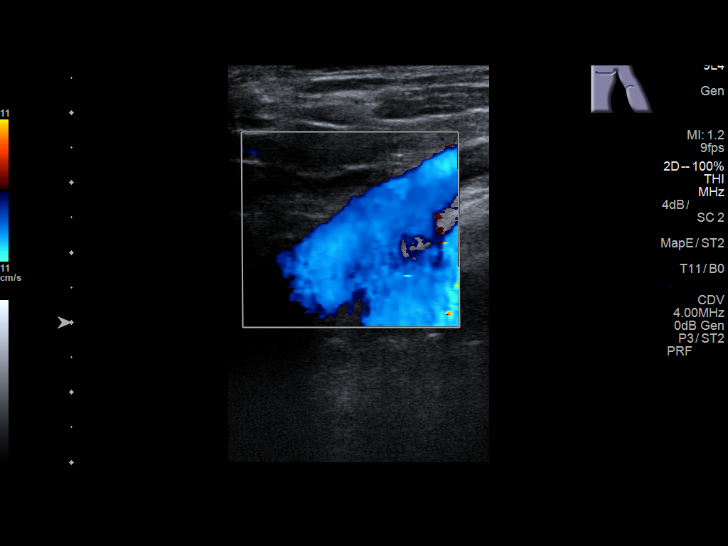
[im 3/32]
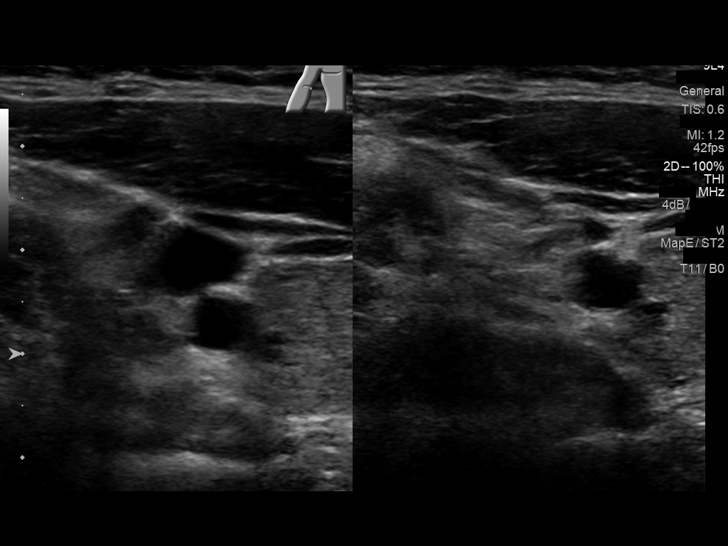
[im 6/32]
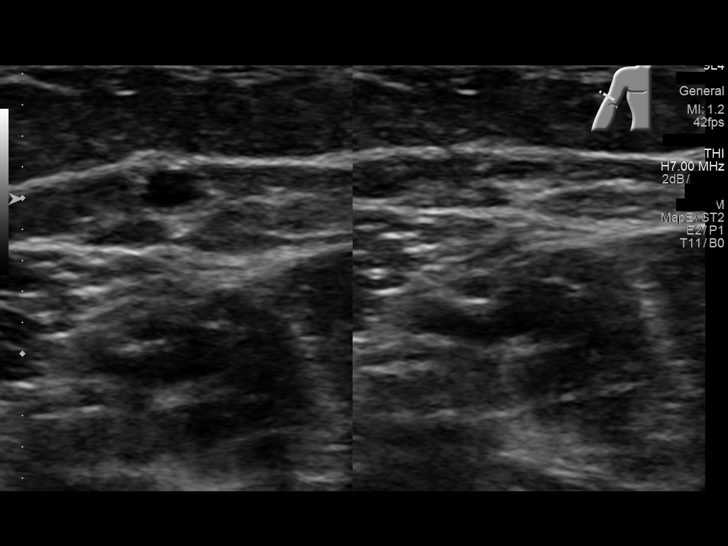
[im 9/32]
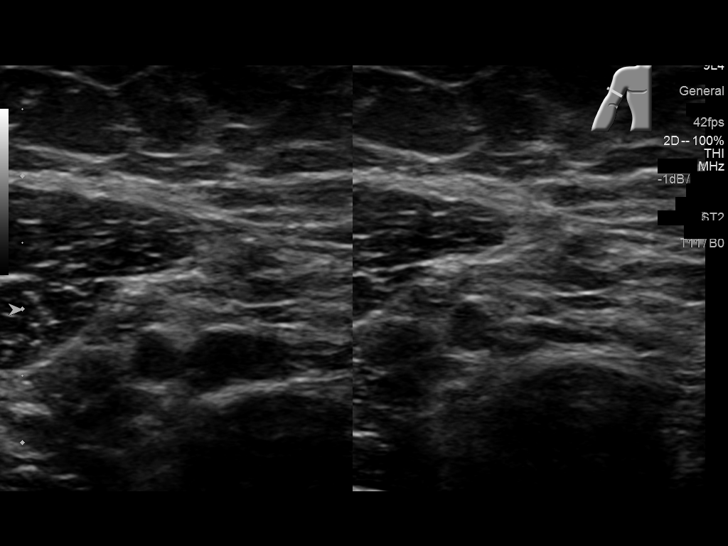
[im 11/32]
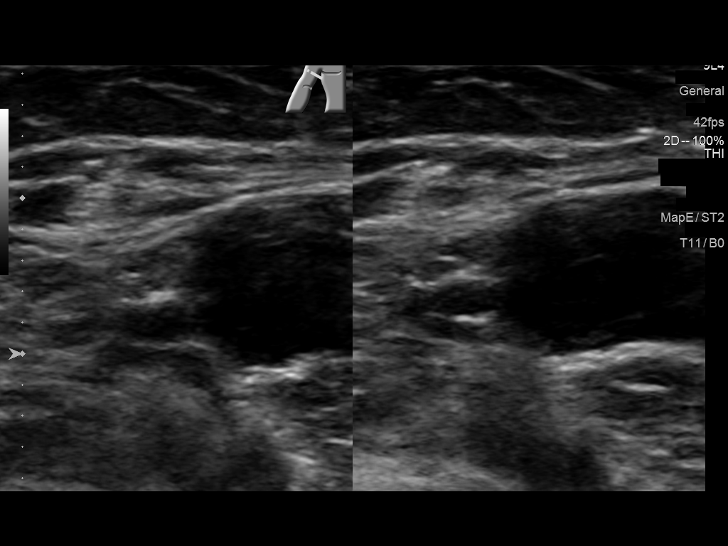
[im 14/32]
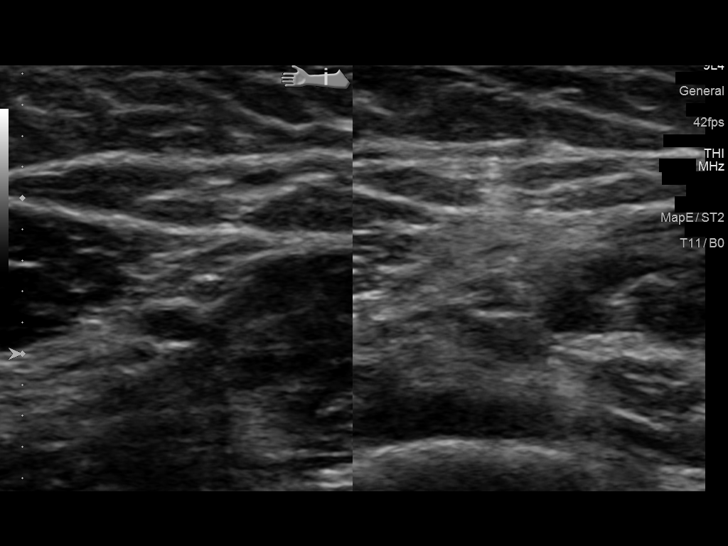
[im 17/32]
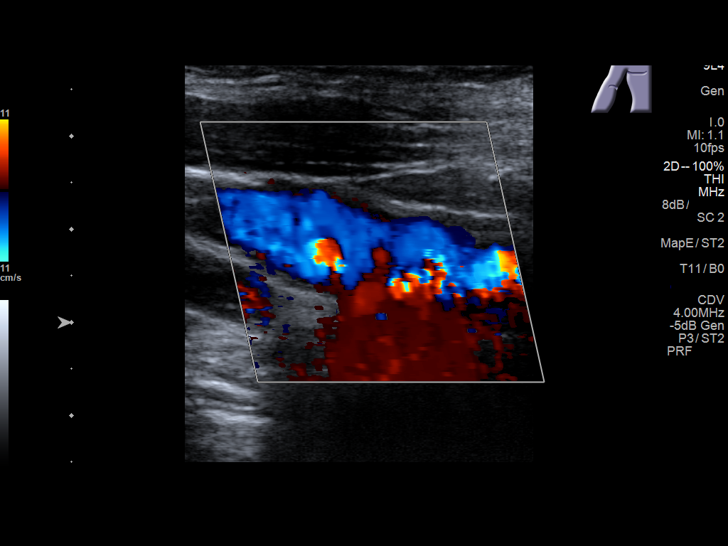
[im 18/32]
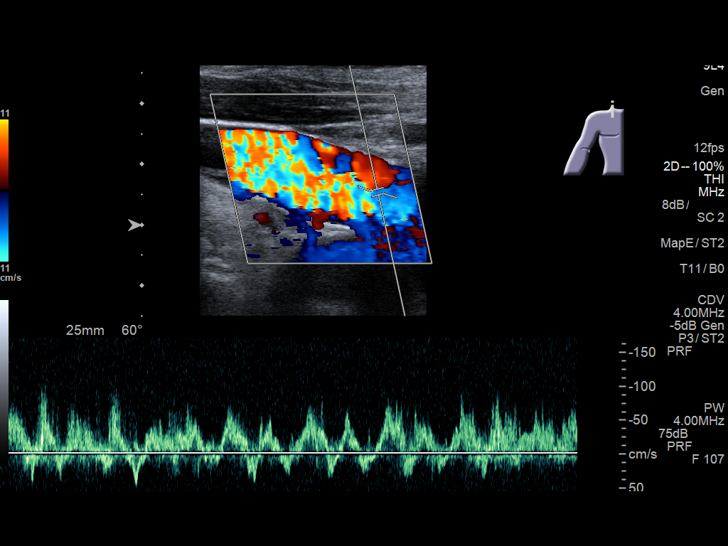
[im 21/32]
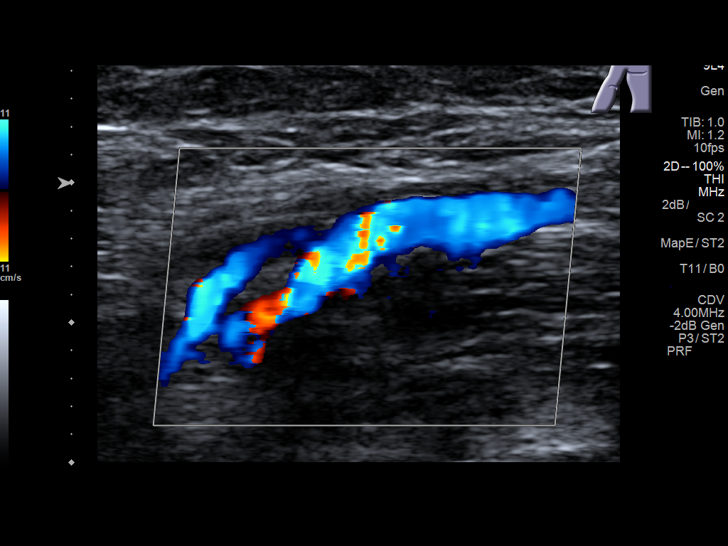
[im 23/32]
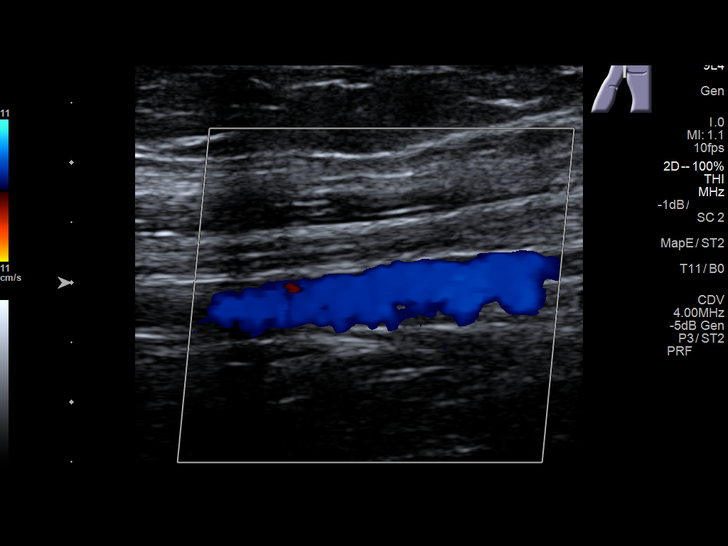
[im 26/32]
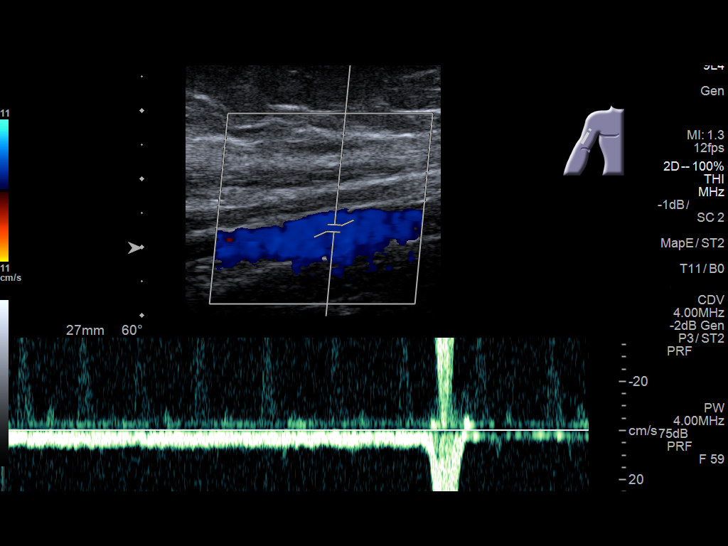
[im 29/32]
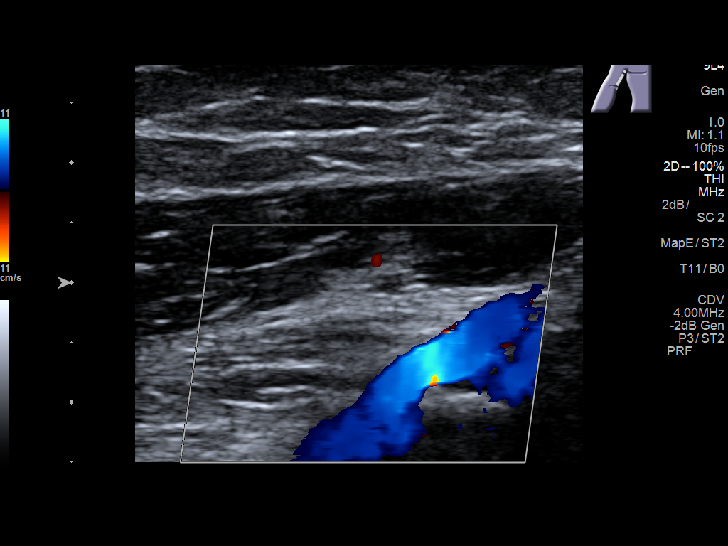
[im 32/32]
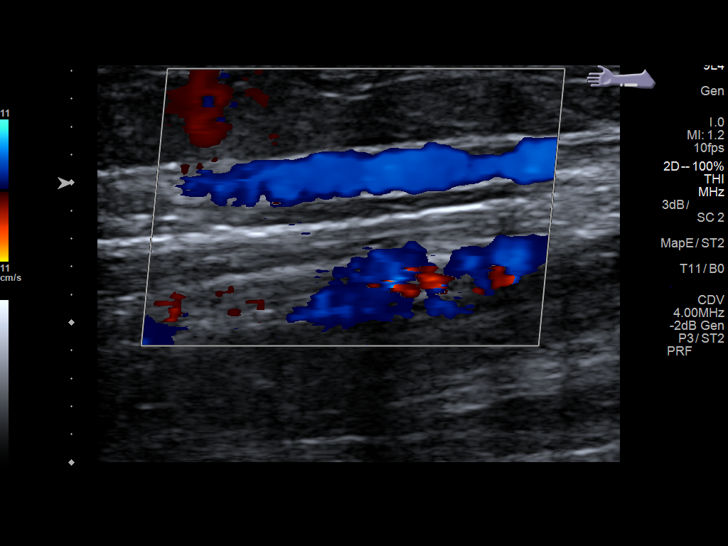

[13 of 24 positions shown; findings below may reference images not displayed]

FINDINGS: Contralateral Subclavian Vein: Respiratory phasicity is normal and
symmetric with the symptomatic side. No evidence of thrombus. Normal
compressibility.

Internal Jugular Vein: No evidence of thrombus. Normal
compressibility, respiratory phasicity and response to augmentation.

Subclavian Vein: No evidence of thrombus. Normal compressibility,
respiratory phasicity and response to augmentation.

Axillary Vein: No evidence of thrombus. Normal compressibility,
respiratory phasicity and response to augmentation.

Cephalic Vein: No evidence of thrombus. Normal compressibility,
respiratory phasicity and response to augmentation.

Basilic Vein: No evidence of thrombus. Normal compressibility,
respiratory phasicity and response to augmentation.

Brachial Veins: No evidence of thrombus. Normal compressibility,
respiratory phasicity and response to augmentation.

Radial Veins: No evidence of thrombus. Normal compressibility,
respiratory phasicity and response to augmentation.

Ulnar Veins: No evidence of thrombus. Normal compressibility,
respiratory phasicity and response to augmentation.

Venous Reflux:  None visualized.

Other Findings:  None visualized.
IMPRESSION: No evidence of deep venous thrombosis.

## 2018-05-16 DIAGNOSIS — N939 Abnormal uterine and vaginal bleeding, unspecified: Secondary | ICD-10-CM | POA: Diagnosis not present

## 2018-05-16 DIAGNOSIS — Z975 Presence of (intrauterine) contraceptive device: Secondary | ICD-10-CM | POA: Diagnosis not present

## 2018-05-16 DIAGNOSIS — F418 Other specified anxiety disorders: Secondary | ICD-10-CM | POA: Diagnosis not present

## 2018-05-16 DIAGNOSIS — Z131 Encounter for screening for diabetes mellitus: Secondary | ICD-10-CM | POA: Diagnosis not present

## 2018-05-16 DIAGNOSIS — Z13220 Encounter for screening for lipoid disorders: Secondary | ICD-10-CM | POA: Diagnosis not present

## 2018-05-20 DIAGNOSIS — Z30431 Encounter for routine checking of intrauterine contraceptive device: Secondary | ICD-10-CM | POA: Diagnosis not present

## 2018-05-20 DIAGNOSIS — N926 Irregular menstruation, unspecified: Secondary | ICD-10-CM | POA: Diagnosis not present

## 2018-05-27 DIAGNOSIS — R945 Abnormal results of liver function studies: Secondary | ICD-10-CM | POA: Diagnosis not present

## 2018-05-27 DIAGNOSIS — Z0000 Encounter for general adult medical examination without abnormal findings: Secondary | ICD-10-CM | POA: Diagnosis not present

## 2018-05-27 DIAGNOSIS — Z975 Presence of (intrauterine) contraceptive device: Secondary | ICD-10-CM | POA: Diagnosis not present

## 2018-05-27 DIAGNOSIS — R7303 Prediabetes: Secondary | ICD-10-CM | POA: Diagnosis not present

## 2018-05-27 DIAGNOSIS — F418 Other specified anxiety disorders: Secondary | ICD-10-CM | POA: Diagnosis not present

## 2018-06-19 DIAGNOSIS — F4322 Adjustment disorder with anxiety: Secondary | ICD-10-CM | POA: Diagnosis not present

## 2018-06-19 DIAGNOSIS — F330 Major depressive disorder, recurrent, mild: Secondary | ICD-10-CM | POA: Diagnosis not present

## 2018-06-19 DIAGNOSIS — F5105 Insomnia due to other mental disorder: Secondary | ICD-10-CM | POA: Diagnosis not present

## 2018-06-24 DIAGNOSIS — Z01419 Encounter for gynecological examination (general) (routine) without abnormal findings: Secondary | ICD-10-CM | POA: Diagnosis not present

## 2018-06-24 DIAGNOSIS — Z30431 Encounter for routine checking of intrauterine contraceptive device: Secondary | ICD-10-CM | POA: Diagnosis not present

## 2018-06-24 DIAGNOSIS — N926 Irregular menstruation, unspecified: Secondary | ICD-10-CM | POA: Diagnosis not present

## 2018-07-17 DIAGNOSIS — L249 Irritant contact dermatitis, unspecified cause: Secondary | ICD-10-CM | POA: Diagnosis not present

## 2018-07-18 DIAGNOSIS — F411 Generalized anxiety disorder: Secondary | ICD-10-CM | POA: Diagnosis not present

## 2018-07-18 DIAGNOSIS — F330 Major depressive disorder, recurrent, mild: Secondary | ICD-10-CM | POA: Diagnosis not present

## 2018-07-18 DIAGNOSIS — F5105 Insomnia due to other mental disorder: Secondary | ICD-10-CM | POA: Diagnosis not present

## 2018-08-02 DIAGNOSIS — F330 Major depressive disorder, recurrent, mild: Secondary | ICD-10-CM | POA: Diagnosis not present

## 2018-08-02 DIAGNOSIS — F5105 Insomnia due to other mental disorder: Secondary | ICD-10-CM | POA: Diagnosis not present

## 2018-08-02 DIAGNOSIS — F411 Generalized anxiety disorder: Secondary | ICD-10-CM | POA: Diagnosis not present

## 2018-08-22 DIAGNOSIS — J040 Acute laryngitis: Secondary | ICD-10-CM | POA: Diagnosis not present

## 2018-08-22 DIAGNOSIS — J320 Chronic maxillary sinusitis: Secondary | ICD-10-CM | POA: Diagnosis not present

## 2018-08-22 DIAGNOSIS — J322 Chronic ethmoidal sinusitis: Secondary | ICD-10-CM | POA: Diagnosis not present

## 2018-12-24 DIAGNOSIS — N898 Other specified noninflammatory disorders of vagina: Secondary | ICD-10-CM | POA: Diagnosis not present

## 2019-10-08 DIAGNOSIS — F418 Other specified anxiety disorders: Secondary | ICD-10-CM | POA: Diagnosis not present

## 2019-10-08 DIAGNOSIS — Z13220 Encounter for screening for lipoid disorders: Secondary | ICD-10-CM | POA: Diagnosis not present

## 2019-10-08 DIAGNOSIS — R7303 Prediabetes: Secondary | ICD-10-CM | POA: Diagnosis not present

## 2019-10-08 DIAGNOSIS — F331 Major depressive disorder, recurrent, moderate: Secondary | ICD-10-CM | POA: Diagnosis not present

## 2020-11-25 DIAGNOSIS — L980 Pyogenic granuloma: Secondary | ICD-10-CM | POA: Diagnosis not present

## 2020-11-25 DIAGNOSIS — D485 Neoplasm of uncertain behavior of skin: Secondary | ICD-10-CM | POA: Diagnosis not present

## 2020-11-25 DIAGNOSIS — R58 Hemorrhage, not elsewhere classified: Secondary | ICD-10-CM | POA: Diagnosis not present

## 2021-02-24 DIAGNOSIS — Z01419 Encounter for gynecological examination (general) (routine) without abnormal findings: Secondary | ICD-10-CM | POA: Diagnosis not present

## 2021-02-24 DIAGNOSIS — Z1151 Encounter for screening for human papillomavirus (HPV): Secondary | ICD-10-CM | POA: Diagnosis not present

## 2021-02-24 DIAGNOSIS — Z30431 Encounter for routine checking of intrauterine contraceptive device: Secondary | ICD-10-CM | POA: Diagnosis not present

## 2021-02-24 DIAGNOSIS — Z79899 Other long term (current) drug therapy: Secondary | ICD-10-CM | POA: Diagnosis not present

## 2021-02-24 DIAGNOSIS — L732 Hidradenitis suppurativa: Secondary | ICD-10-CM | POA: Diagnosis not present

## 2021-02-24 DIAGNOSIS — R87810 Cervical high risk human papillomavirus (HPV) DNA test positive: Secondary | ICD-10-CM | POA: Diagnosis not present

## 2021-02-24 DIAGNOSIS — Z124 Encounter for screening for malignant neoplasm of cervix: Secondary | ICD-10-CM | POA: Diagnosis not present

## 2021-02-24 DIAGNOSIS — N946 Dysmenorrhea, unspecified: Secondary | ICD-10-CM | POA: Diagnosis not present

## 2021-02-27 ENCOUNTER — Emergency Department
Admission: EM | Admit: 2021-02-27 | Discharge: 2021-02-27 | Disposition: A | Discharge status: Home / Self Care | Payer: BC Managed Care – PPO | Attending: Emergency Medicine | Admitting: Emergency Medicine

## 2021-02-27 ENCOUNTER — Encounter: Payer: Self-pay | Admitting: Emergency Medicine

## 2021-02-27 ENCOUNTER — Other Ambulatory Visit: Payer: Self-pay

## 2021-02-27 DIAGNOSIS — S61012A Laceration without foreign body of left thumb without damage to nail, initial encounter: Secondary | ICD-10-CM | POA: Diagnosis not present

## 2021-02-27 DIAGNOSIS — W231XXA Caught, crushed, jammed, or pinched between stationary objects, initial encounter: Secondary | ICD-10-CM | POA: Insufficient documentation

## 2021-02-27 DIAGNOSIS — S6992XA Unspecified injury of left wrist, hand and finger(s), initial encounter: Secondary | ICD-10-CM | POA: Diagnosis not present

## 2021-02-27 MED ORDER — LIDOCAINE HCL (PF) 1 % IJ SOLN
5.0000 mL | Freq: Once | INTRAMUSCULAR | Status: AC
  Administered 2021-02-27: 5 mL via INTRADERMAL
  Filled 2021-02-27: qty 5

## 2021-02-27 NOTE — Discharge Instructions (Addendum)
Follow-up with your regular doctor remove the sutures herself in 7 to 10 days.  Keep the area clean and dry.  You may remove the bandage in 24 hours.  At that time we cannot just apply a Band-Aid across the area if you are outside or in public.  If you are at home let the air get to the wound as it will heal faster.  Return if any sign of infection.
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
## 2021-09-25 DIAGNOSIS — R7303 Prediabetes: Secondary | ICD-10-CM | POA: Diagnosis not present

## 2021-09-25 DIAGNOSIS — E119 Type 2 diabetes mellitus without complications: Secondary | ICD-10-CM | POA: Diagnosis not present

## 2021-09-25 DIAGNOSIS — F418 Other specified anxiety disorders: Secondary | ICD-10-CM | POA: Diagnosis not present

## 2021-12-26 DIAGNOSIS — H16041 Marginal corneal ulcer, right eye: Secondary | ICD-10-CM | POA: Diagnosis not present

## 2021-12-27 DIAGNOSIS — E1165 Type 2 diabetes mellitus with hyperglycemia: Secondary | ICD-10-CM | POA: Diagnosis not present

## 2021-12-27 DIAGNOSIS — G479 Sleep disorder, unspecified: Secondary | ICD-10-CM | POA: Diagnosis not present

## 2021-12-27 DIAGNOSIS — F418 Other specified anxiety disorders: Secondary | ICD-10-CM | POA: Diagnosis not present

## 2021-12-27 DIAGNOSIS — H16041 Marginal corneal ulcer, right eye: Secondary | ICD-10-CM | POA: Diagnosis not present

## 2021-12-27 DIAGNOSIS — F331 Major depressive disorder, recurrent, moderate: Secondary | ICD-10-CM | POA: Diagnosis not present

## 2021-12-27 DIAGNOSIS — R748 Abnormal levels of other serum enzymes: Secondary | ICD-10-CM | POA: Diagnosis not present

## 2021-12-27 DIAGNOSIS — H20031 Secondary infectious iridocyclitis, right eye: Secondary | ICD-10-CM | POA: Diagnosis not present

## 2022-04-24 DIAGNOSIS — J0100 Acute maxillary sinusitis, unspecified: Secondary | ICD-10-CM | POA: Diagnosis not present

## 2022-04-24 DIAGNOSIS — H6692 Otitis media, unspecified, left ear: Secondary | ICD-10-CM | POA: Diagnosis not present

## 2022-05-11 DIAGNOSIS — J302 Other seasonal allergic rhinitis: Secondary | ICD-10-CM | POA: Diagnosis not present

## 2022-05-11 DIAGNOSIS — H9202 Otalgia, left ear: Secondary | ICD-10-CM | POA: Diagnosis not present

## 2022-07-30 DIAGNOSIS — Z01419 Encounter for gynecological examination (general) (routine) without abnormal findings: Secondary | ICD-10-CM | POA: Diagnosis not present

## 2022-07-30 DIAGNOSIS — Z30431 Encounter for routine checking of intrauterine contraceptive device: Secondary | ICD-10-CM | POA: Diagnosis not present

## 2022-07-30 DIAGNOSIS — R87810 Cervical high risk human papillomavirus (HPV) DNA test positive: Secondary | ICD-10-CM | POA: Diagnosis not present

## 2022-08-31 DIAGNOSIS — J019 Acute sinusitis, unspecified: Secondary | ICD-10-CM | POA: Diagnosis not present

## 2022-11-09 DIAGNOSIS — E1165 Type 2 diabetes mellitus with hyperglycemia: Secondary | ICD-10-CM | POA: Diagnosis not present

## 2022-11-09 DIAGNOSIS — R739 Hyperglycemia, unspecified: Secondary | ICD-10-CM | POA: Diagnosis not present

## 2022-11-15 DIAGNOSIS — E1165 Type 2 diabetes mellitus with hyperglycemia: Secondary | ICD-10-CM | POA: Diagnosis not present

## 2022-11-15 DIAGNOSIS — G2581 Restless legs syndrome: Secondary | ICD-10-CM | POA: Diagnosis not present

## 2022-11-29 DIAGNOSIS — J019 Acute sinusitis, unspecified: Secondary | ICD-10-CM | POA: Diagnosis not present

## 2022-12-27 DIAGNOSIS — Z30433 Encounter for removal and reinsertion of intrauterine contraceptive device: Secondary | ICD-10-CM | POA: Diagnosis not present
